# Patient Record
Sex: Female | Born: 1965 | Race: White | Hispanic: No | Smoking: Former smoker
Health system: Southern US, Community
[De-identification: ages and names within clinical notes are randomized; demographics above are authoritative.]

## PROBLEM LIST (undated history)

## (undated) DIAGNOSIS — I10 Essential (primary) hypertension: Secondary | ICD-10-CM

## (undated) DIAGNOSIS — J45909 Unspecified asthma, uncomplicated: Secondary | ICD-10-CM

## (undated) DIAGNOSIS — J302 Other seasonal allergic rhinitis: Secondary | ICD-10-CM

## (undated) DIAGNOSIS — G4730 Sleep apnea, unspecified: Secondary | ICD-10-CM

## (undated) DIAGNOSIS — E669 Obesity, unspecified: Secondary | ICD-10-CM

---

## 2005-03-03 ENCOUNTER — Ambulatory Visit: Payer: Self-pay | Admitting: Obstetrics and Gynecology

## 2009-04-26 ENCOUNTER — Ambulatory Visit: Payer: Self-pay | Admitting: Obstetrics and Gynecology

## 2009-07-18 ENCOUNTER — Ambulatory Visit: Payer: Self-pay | Admitting: Family Medicine

## 2011-08-27 ENCOUNTER — Ambulatory Visit: Payer: Self-pay | Admitting: Family Medicine

## 2012-10-26 ENCOUNTER — Ambulatory Visit: Payer: Self-pay | Admitting: Nurse Practitioner

## 2013-11-21 ENCOUNTER — Ambulatory Visit: Payer: Self-pay | Admitting: Nurse Practitioner

## 2015-04-25 ENCOUNTER — Other Ambulatory Visit: Payer: Self-pay | Admitting: Nurse Practitioner

## 2015-04-25 DIAGNOSIS — Z1231 Encounter for screening mammogram for malignant neoplasm of breast: Secondary | ICD-10-CM

## 2015-05-03 ENCOUNTER — Ambulatory Visit: Payer: Self-pay

## 2015-05-09 ENCOUNTER — Ambulatory Visit
Admission: RE | Admit: 2015-05-09 | Discharge: 2015-05-09 | Disposition: A | Discharge status: Home / Self Care | Payer: BLUE CROSS/BLUE SHIELD | Source: Ambulatory Visit | Attending: Nurse Practitioner | Admitting: Nurse Practitioner

## 2015-05-09 DIAGNOSIS — Z1231 Encounter for screening mammogram for malignant neoplasm of breast: Secondary | ICD-10-CM | POA: Diagnosis not present

## 2015-05-11 ENCOUNTER — Other Ambulatory Visit: Payer: Self-pay | Admitting: Nurse Practitioner

## 2015-05-11 DIAGNOSIS — R928 Other abnormal and inconclusive findings on diagnostic imaging of breast: Secondary | ICD-10-CM

## 2015-05-17 ENCOUNTER — Ambulatory Visit
Admission: RE | Admit: 2015-05-17 | Discharge: 2015-05-17 | Disposition: A | Discharge status: Home / Self Care | Payer: BLUE CROSS/BLUE SHIELD | Source: Ambulatory Visit | Attending: Nurse Practitioner | Admitting: Nurse Practitioner

## 2015-05-17 ENCOUNTER — Ambulatory Visit: Payer: BLUE CROSS/BLUE SHIELD

## 2015-05-17 DIAGNOSIS — R928 Other abnormal and inconclusive findings on diagnostic imaging of breast: Secondary | ICD-10-CM

## 2015-05-17 DIAGNOSIS — R922 Inconclusive mammogram: Secondary | ICD-10-CM | POA: Diagnosis not present

## 2016-09-01 ENCOUNTER — Other Ambulatory Visit: Payer: Self-pay | Admitting: Nurse Practitioner

## 2016-09-01 DIAGNOSIS — Z1231 Encounter for screening mammogram for malignant neoplasm of breast: Secondary | ICD-10-CM

## 2016-09-26 ENCOUNTER — Ambulatory Visit
Admission: RE | Admit: 2016-09-26 | Discharge: 2016-09-26 | Disposition: A | Discharge status: Home / Self Care | Payer: BLUE CROSS/BLUE SHIELD | Source: Ambulatory Visit | Attending: Nurse Practitioner | Admitting: Nurse Practitioner

## 2016-09-26 DIAGNOSIS — Z1231 Encounter for screening mammogram for malignant neoplasm of breast: Secondary | ICD-10-CM

## 2018-05-18 ENCOUNTER — Other Ambulatory Visit: Payer: Self-pay | Admitting: Internal Medicine

## 2018-05-18 DIAGNOSIS — Z1231 Encounter for screening mammogram for malignant neoplasm of breast: Secondary | ICD-10-CM

## 2018-08-26 ENCOUNTER — Encounter (INDEPENDENT_AMBULATORY_CARE_PROVIDER_SITE_OTHER): Payer: Self-pay

## 2018-08-26 ENCOUNTER — Ambulatory Visit
Admission: RE | Admit: 2018-08-26 | Discharge: 2018-08-26 | Disposition: A | Discharge status: Home / Self Care | Payer: BLUE CROSS/BLUE SHIELD | Source: Ambulatory Visit | Attending: Internal Medicine | Admitting: Internal Medicine

## 2018-08-26 DIAGNOSIS — Z1231 Encounter for screening mammogram for malignant neoplasm of breast: Secondary | ICD-10-CM | POA: Diagnosis present

## 2019-08-23 ENCOUNTER — Other Ambulatory Visit: Payer: Self-pay | Admitting: Internal Medicine

## 2019-08-23 DIAGNOSIS — Z1231 Encounter for screening mammogram for malignant neoplasm of breast: Secondary | ICD-10-CM

## 2019-09-01 ENCOUNTER — Other Ambulatory Visit: Payer: Self-pay

## 2019-09-01 ENCOUNTER — Encounter (INDEPENDENT_AMBULATORY_CARE_PROVIDER_SITE_OTHER): Payer: Self-pay

## 2019-09-01 ENCOUNTER — Ambulatory Visit
Admission: RE | Admit: 2019-09-01 | Discharge: 2019-09-01 | Disposition: A | Discharge status: Home / Self Care | Payer: BC Managed Care – PPO | Source: Ambulatory Visit | Attending: Internal Medicine | Admitting: Internal Medicine

## 2019-09-01 DIAGNOSIS — Z1231 Encounter for screening mammogram for malignant neoplasm of breast: Secondary | ICD-10-CM

## 2020-09-05 ENCOUNTER — Other Ambulatory Visit: Payer: Self-pay | Admitting: Internal Medicine

## 2020-09-05 DIAGNOSIS — Z1231 Encounter for screening mammogram for malignant neoplasm of breast: Secondary | ICD-10-CM

## 2020-09-13 ENCOUNTER — Ambulatory Visit
Admission: RE | Admit: 2020-09-13 | Discharge: 2020-09-13 | Disposition: A | Discharge status: Home / Self Care | Payer: BC Managed Care – PPO | Source: Ambulatory Visit | Attending: Internal Medicine | Admitting: Internal Medicine

## 2020-09-13 ENCOUNTER — Other Ambulatory Visit: Payer: Self-pay

## 2020-09-13 DIAGNOSIS — Z1231 Encounter for screening mammogram for malignant neoplasm of breast: Secondary | ICD-10-CM | POA: Diagnosis present

## 2021-11-05 ENCOUNTER — Other Ambulatory Visit: Payer: Self-pay | Admitting: Internal Medicine

## 2021-11-05 DIAGNOSIS — Z1231 Encounter for screening mammogram for malignant neoplasm of breast: Secondary | ICD-10-CM

## 2021-12-30 ENCOUNTER — Other Ambulatory Visit: Payer: Self-pay

## 2021-12-30 ENCOUNTER — Ambulatory Visit
Admission: RE | Admit: 2021-12-30 | Discharge: 2021-12-30 | Disposition: A | Discharge status: Home / Self Care | Payer: BC Managed Care – PPO | Source: Ambulatory Visit | Attending: Internal Medicine | Admitting: Internal Medicine

## 2021-12-30 DIAGNOSIS — Z1231 Encounter for screening mammogram for malignant neoplasm of breast: Secondary | ICD-10-CM | POA: Insufficient documentation

## 2022-04-21 ENCOUNTER — Emergency Department: Payer: 59

## 2022-04-21 ENCOUNTER — Emergency Department
Admission: EM | Admit: 2022-04-21 | Discharge: 2022-04-21 | Disposition: A | Discharge status: Home / Self Care | Payer: 59 | Attending: Student in an Organized Health Care Education/Training Program | Admitting: Student in an Organized Health Care Education/Training Program

## 2022-04-21 ENCOUNTER — Encounter: Payer: Self-pay | Admitting: Emergency Medicine

## 2022-04-21 ENCOUNTER — Other Ambulatory Visit: Payer: Self-pay

## 2022-04-21 DIAGNOSIS — W19XXXA Unspecified fall, initial encounter: Secondary | ICD-10-CM

## 2022-04-21 DIAGNOSIS — W08XXXA Fall from other furniture, initial encounter: Secondary | ICD-10-CM | POA: Diagnosis not present

## 2022-04-21 DIAGNOSIS — M25512 Pain in left shoulder: Secondary | ICD-10-CM | POA: Diagnosis not present

## 2022-04-21 DIAGNOSIS — I10 Essential (primary) hypertension: Secondary | ICD-10-CM | POA: Diagnosis not present

## 2022-04-21 DIAGNOSIS — M545 Low back pain, unspecified: Secondary | ICD-10-CM | POA: Diagnosis present

## 2022-04-21 DIAGNOSIS — Y92009 Unspecified place in unspecified non-institutional (private) residence as the place of occurrence of the external cause: Secondary | ICD-10-CM | POA: Diagnosis not present

## 2022-04-21 MED ORDER — ACETAMINOPHEN 325 MG PO TABS
650.0000 mg | ORAL_TABLET | Freq: Once | ORAL | Status: AC
Start: 2022-04-21 — End: 2022-04-21
  Administered 2022-04-21: 650 mg via ORAL
  Filled 2022-04-21: qty 2

## 2022-04-21 MED ORDER — LIDOCAINE 5 % EX PTCH: 1.0000 | MEDICATED_PATCH | Freq: Two times a day (BID) | CUTANEOUS | 0 refills | Status: AC

## 2022-04-21 MED ORDER — LIDOCAINE 5 % EX PTCH
1.0000 | MEDICATED_PATCH | CUTANEOUS | Status: DC
  Administered 2022-04-21: 1 via TRANSDERMAL
  Filled 2022-04-21: qty 1

## 2022-04-21 MED ORDER — KETOROLAC TROMETHAMINE 15 MG/ML IJ SOLN
15.0000 mg | Freq: Once | INTRAMUSCULAR | Status: AC
  Administered 2022-04-21: 15 mg via INTRAMUSCULAR
  Filled 2022-04-21: qty 1

## 2022-04-21 NOTE — ED Notes (Signed)
Patient transported to X-ray 

## 2022-04-21 NOTE — ED Triage Notes (Signed)
Pt reports was stepping on a step stool, lost her balance and fell. Pt c/o pain to her lower back. Denies loss of bowel or bladder, denies LOC.

## 2022-04-21 NOTE — ED Provider Notes (Signed)
[facility] Provider Note    Event Date/Time   First MD Initiated Contact with Patient 04/21/22 [phone]     (approximate)   History   Fall and Back Pain   HPI  Maria Salas is a 56 y.o. female with a past medical history of hypertension, GERD, depression, obesity, restless leg syndrome who presents today for evaluation after a fall.  Patient reports that she was standing on a small stepstool on the first step to clean the top of the television when she leaned too far and lost her balance.  No preceding symptoms.  She reports that she fell onto her left shoulder and twisted her back in the process.  She reports that she has pain across her low back.  She reports that she does not have any pain if she stays still, however when she twists she reports that the pain radiates across her low back.  She denies any numbness or tingling in her legs.  She denies any urinary or fecal incontinence or retention.  No saddle anesthesia.  She reports that she has been able to ambulate, however has pain with ambulation.  No fevers.  She also reports that she has pain in her left shoulder.  She did not strike her head or lose consciousness.  She does not take anticoagulation.  She denies any abdominal pain or chest pain.  No headache, neck pain, vomiting, vision changes, or difficulty with ambulation.  She has not taken anything for pain today.  There are no problems to display for this patient.         Physical Exam   Triage Vital Signs: ED Triage Vitals  Enc Vitals Group     BP 04/21/22 0822 (!) 150/103     Pulse Rate 04/21/22 0822 (!) 107     Resp 04/21/22 0822 18     Temp 04/21/22 0822 98.3 F (36.8 C)     Temp src --      SpO2 04/21/22 0822 99 %     Weight 04/21/22 0819 275 lb (124.7 kg)     Height 04/21/22 0819 5\' 1"  (1.549 m)     Head Circumference --      Peak Flow --      Pain Score 04/21/22 0819 8     Pain Loc --      Pain Edu? --      Excl. in [place]? --      Most recent vital signs: Vitals:   04/21/22 0822  BP: (!) 150/103  Pulse: (!) 107  Resp: 18  Temp: 98.3 F (36.8 C)  SpO2: 99%    Physical Exam Vitals and nursing note reviewed.  Constitutional:      General: Awake and alert. No acute distress.    Appearance: Normal appearance. She is well-developed and obese.  HENT:     Head: Normocephalic and atraumatic.     Mouth/Throat:     Mouth: Mucous membranes are moist.  Eyes:     General: PERRL. Normal EOMs        Right eye: No discharge.        Left eye: No discharge.     Conjunctiva/sclera: Conjunctivae normal.  Cardiovascular:     Rate and Rhythm: Normal rate and regular rhythm.     Pulses: Normal pulses.     Heart sounds: Normal heart sounds Pulmonary:     Effort: Pulmonary effort is normal. No respiratory distress.     Breath sounds: Normal  breath sounds.  Abdominal:     Abdomen is soft. There is no abdominal tenderness. No rebound or guarding. No distention. Musculoskeletal:        General: No swelling. Normal range of motion.     Cervical back: Normal range of motion and neck supple. No midline cervical spine tenderness.  Full range of motion of neck.  Negative Spurling test.  Negative Lhermitte sign.  Normal strength and sensation in bilateral upper extremities. Normal grip strength bilaterally.  Normal intrinsic muscle function of the hand bilaterally.  Normal radial pulses bilaterally. Back: Diffuse bilateral lumbar paraspinal muscle tenderness without specific midline tenderness. Strength and sensation 5/5 to bilateral lower extremities. Normal great toe extension against resistance. Normal sensation throughout feet. Normal patellar reflexes. Negative SLR and opposite SLR bilaterally.  Left shoulder: No obvious deformity, swelling, ecchymosis, or erythema No clavicular or AC joint tenderness Able to actively and passively forward flex and abduct at shoulder fully, negative drop arm test Negative Obriens, SLAP, empty  can, and lift off tests Normal internal and external rotation against resistance Negative Hawkins and Neers Normal ROM at elbow and wrist Normal resisted pronation and supination 2+ radial pulse Normal grip strength Normal intrinsic hand muscle function Lymphadenopathy:     Cervical: No cervical adenopathy.  Skin:    General: Skin is warm and dry.     Capillary Refill: Capillary refill takes less than 2 seconds.     Findings: No rash.  Neurological:     Mental Status: she is alert.      ED Results / Procedures / Treatments   Labs (all labs ordered are listed, but only abnormal results are displayed) Labs Reviewed - No data to display   EKG     RADIOLOGY I reviewed patient's x-ray independently and agree with the radiologist findings    PROCEDURES:  Critical Care performed:   Procedures   MEDICATIONS ORDERED IN ED: Medications  lidocaine (LIDODERM) 5 % 1 patch (1 patch Transdermal Patch Applied 04/21/22 0921)  ketorolac (TORADOL) 15 MG/ML injection 15 mg (15 mg Intramuscular Given 04/21/22 0926)  acetaminophen (TYLENOL) tablet 650 mg (650 mg Oral Given 04/21/22 [id number])     IMPRESSION / MDM / ASSESSMENT AND PLAN / ED COURSE  I reviewed the triage vital signs and the nursing notes.   Differential diagnosis includes, but is not limited to, contusion, sprain, vertebral fracture, radiculopathy.  Patient is awake and alert, and ambulatory.  She has 5 out of 5 strength with intact sensation to extensor hallucis dorsiflexion and plantarflexion of bilateral lower extremities with normal patellar reflexes bilaterally. Most likely etiology at this point is muscle strain vs herniated disc. No red flags to indicate patient is at risk for more auspicious process that would require urgent/emergent spinal imaging or subspecialty evaluation at this time. No major trauma, no midline tenderness, no history or physical exam findings to suggest cauda equina syndrome or spinal cord  compression. No focal neurological deficits on exam. No constitutional symptoms or history of immunosuppression or IVDA to suggest potential for epidural abscess. Not anticoagulated, no history of bleeding diastasis to suggest risk for epidural hematoma. No chronic steroid use or advanced age or history of malignancy to suggest proclivity towards pathological fracture.  No abdominal pain or flank pain to suggest kidney stone, no history of kidney stone.  No fever or dysuria or CVAT to suggest pyelonephritis .  No chest pain, back pain, shortness of breath, neurological deficits, to suggest vascular catastrophe, and pulses are equal  in all 4 extremities.    No symptoms prior to fall, patient is certain that this was a mechanical fall.  There was no head strike or LOC. X-ray was obtained in triage.  This demonstrated degenerative changes without acute fracture.  Patient was treated symptomatically with improvement of her symptoms.  Patient has full range of motion of her shoulder, declined x-ray of her shoulder.  We discussed return precautions and the importance of close outpatient follow-up. Discussed care instructions and return precautions with patient. Recommended close outpatient follow-up for re-evaluation. Patient agrees with plan of care. Will treat the patient symptomatically as needed for pain control. Will discharge patient to take these medications and return for any worsening or different pain or development of any neurologic symptoms. Educated patient regarding expected time course for back pain to improve and recommended very close outpatient follow-up.  Patient was discharged in stable condition.  She is ambulatory with a steady gait.   Clinical Course as of 04/21/22 0950  Mon Apr 21, 2022  1448 Patient declined shoulder XR [JP]  [phone] Patient reports she feels improved, requesting lidoderm patch prescription [JP]    Clinical Course User Index [JP] Zollie Ellery, Herb Grays, PA-C     FINAL CLINICAL  IMPRESSION(S) / ED DIAGNOSES   Final diagnoses:  Acute bilateral low back pain without sciatica  Fall in home, initial encounter     Rx / DC Orders   ED Discharge Orders          Ordered    lidocaine (LIDODERM) 5 %  Every 12 hours        04/21/22 0940             Note:  This document was prepared using Dragon voice recognition software and may include unintentional dictation errors.   Keturah Shavers 04/21/22 3149    Willy Eddy, MD 04/21/22 [phone]

## 2022-04-21 NOTE — Discharge Instructions (Signed)
You may use the Lidoderm patches in addition to Tylenol/ibuprofen per package instructions to help with your pain.  Please return to the emergency department for any new, worsening, or changing symptoms or other concerns including weakness in your legs, urinary or stool incontinence or retention, numbness or tingling in your extremities/buttocks/groin, fevers, or any other concerns or change in symptoms.  It was a pleasure caring for you today.

## 2022-04-21 NOTE — ED Notes (Signed)
Pt states that she was standing on a step-stool yesterday and fell backwards. Pt reports twisting and landing on her left side. Pt has been having pain in her lower back since falling. Pt reports having tingling in her hands as well and in the tops of her legs if she standing for too long. Pt denies loss of bowel or bladder control.

## 2022-10-15 LAB — COLOGUARD: COLOGUARD: NEGATIVE

## 2022-10-15 LAB — EXTERNAL GENERIC LAB PROCEDURE: COLOGUARD: NEGATIVE

## 2023-01-27 ENCOUNTER — Other Ambulatory Visit: Payer: Self-pay

## 2023-01-27 ENCOUNTER — Ambulatory Visit (INDEPENDENT_AMBULATORY_CARE_PROVIDER_SITE_OTHER): Payer: 59

## 2023-01-27 ENCOUNTER — Ambulatory Visit
Admission: EM | Admit: 2023-01-27 | Discharge: 2023-01-27 | Disposition: A | Discharge status: Home / Self Care | Payer: 59

## 2023-01-27 DIAGNOSIS — M545 Low back pain, unspecified: Secondary | ICD-10-CM

## 2023-01-27 DIAGNOSIS — M25552 Pain in left hip: Secondary | ICD-10-CM

## 2023-01-27 DIAGNOSIS — W19XXXA Unspecified fall, initial encounter: Secondary | ICD-10-CM | POA: Diagnosis not present

## 2023-01-27 HISTORY — DX: Essential (primary) hypertension: I10

## 2023-01-27 HISTORY — DX: Other seasonal allergic rhinitis: J30.2

## 2023-01-27 HISTORY — DX: Obesity, unspecified: E66.9

## 2023-01-27 HISTORY — DX: Sleep apnea, unspecified: G47.30

## 2023-01-27 HISTORY — DX: Unspecified asthma, uncomplicated: J45.909

## 2023-01-27 MED ORDER — NAPROXEN 375 MG PO TABS: 375.0000 mg | ORAL_TABLET | Freq: Two times a day (BID) | ORAL | 0 refills | Status: AC

## 2023-01-27 MED ORDER — LIDOCAINE 5 % EX PTCH: 1.0000 | MEDICATED_PATCH | CUTANEOUS | 0 refills | Status: AC

## 2023-01-27 MED ORDER — METHOCARBAMOL 500 MG PO TABS: 500.0000 mg | ORAL_TABLET | Freq: Two times a day (BID) | ORAL | 0 refills | Status: AC

## 2023-01-27 NOTE — ED Triage Notes (Signed)
Pt states she tripped on rug in home last evening and jarred her left hip. Today she bent over to pick something up and could hardly straighten back up. Pain to left hip, left buttock and left low back.
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
## 2023-01-27 NOTE — Discharge Instructions (Signed)
Your x-rays did not show any active fracture or dislocation.  It did show some degeneration/arthritis.  Start Naprosyn twice daily to help with pain and inflammation.  Do not take NSAIDs with this medication including aspirin, ibuprofen/Advil, naproxen/Aleve.  This includes your prescribed meloxicam/Mobic.  You can use acetaminophen/Tylenol.  Take Robaxin up to twice a day.  This make you sleepy so do not drive or drink alcohol while taking it.  Use lidocaine patches for additional symptom relief.  I recommend you follow-up with sports medicine/orthopedics if your symptoms or not improving quickly.  Please call them to schedule an appointment.  Use heat and gentle rest.  If anything worsens and you have increasing pain, difficulty walking, numbness or tingling in your legs, going to the bathroom yourself without noticing it you need to be seen immediately.

## 2023-02-09 ENCOUNTER — Other Ambulatory Visit: Payer: Self-pay | Admitting: Internal Medicine

## 2023-02-09 DIAGNOSIS — Z1231 Encounter for screening mammogram for malignant neoplasm of breast: Secondary | ICD-10-CM

## 2023-02-10 ENCOUNTER — Ambulatory Visit
Admission: RE | Admit: 2023-02-10 | Discharge: 2023-02-10 | Disposition: A | Discharge status: Home / Self Care | Payer: 59 | Source: Ambulatory Visit | Attending: Internal Medicine | Admitting: Internal Medicine

## 2023-02-10 DIAGNOSIS — Z1231 Encounter for screening mammogram for malignant neoplasm of breast: Secondary | ICD-10-CM | POA: Diagnosis present

## 2023-04-24 ENCOUNTER — Emergency Department
Admission: EM | Admit: 2023-04-24 | Discharge: 2023-04-24 | Disposition: A | Discharge status: Home / Self Care | Payer: 59 | Attending: Emergency Medicine | Admitting: Emergency Medicine

## 2023-04-24 ENCOUNTER — Emergency Department: Payer: 59

## 2023-04-24 ENCOUNTER — Other Ambulatory Visit: Payer: Self-pay

## 2023-04-24 DIAGNOSIS — R0789 Other chest pain: Secondary | ICD-10-CM | POA: Diagnosis present

## 2023-04-24 LAB — BASIC METABOLIC PANEL
Anion gap: 9 (ref 5–15)
BUN: 12 mg/dL (ref 6–20)
CO2: 25 mmol/L (ref 22–32)
Calcium: 8.9 mg/dL (ref 8.9–10.3)
Chloride: 104 mmol/L (ref 98–111)
Creatinine, Ser: 0.64 mg/dL (ref 0.44–1.00)
GFR, Estimated: >60 mL/min (ref >60)
Glucose, Bld: 96 mg/dL (ref 70–99)
Potassium: 3.9 mmol/L (ref 3.5–5.1)
Sodium: 138 mmol/L (ref 135–145)

## 2023-04-24 LAB — CBC
HCT: 49.4 % — ABNORMAL HIGH (ref 36.0–46.0)
Hemoglobin: 15.7 g/dL — ABNORMAL HIGH (ref 12.0–15.0)
MCH: 29.6 pg (ref 26.0–34.0)
MCHC: 31.8 g/dL (ref 30.0–36.0)
MCV: 93 fL (ref 80.0–100.0)
Platelets: 245 10*3/uL (ref 150–400)
RBC: 5.31 MIL/uL — ABNORMAL HIGH (ref 3.87–5.11)
RDW: 12.9 % (ref 11.5–15.5)
WBC: 7.1 10*3/uL (ref 4.0–10.5)
nRBC: 0 % (ref 0.0–0.2)

## 2023-04-24 LAB — TROPONIN I (HIGH SENSITIVITY)
Troponin I (High Sensitivity): 7 ng/L (ref <18)
Troponin I (High Sensitivity): 7 ng/L (ref <18)

## 2023-04-24 MED ORDER — HYDROCODONE-ACETAMINOPHEN 5-325 MG PO TABS
2.0000 | ORAL_TABLET | Freq: Once | ORAL | Status: DC
  Filled 2023-04-24: qty 2

## 2023-04-24 MED ORDER — KETOROLAC TROMETHAMINE 60 MG/2ML IM SOLN
60.0000 mg | Freq: Once | INTRAMUSCULAR | Status: DC
  Filled 2023-04-24: qty 2

## 2023-04-24 NOTE — ED Provider Notes (Signed)
[facility] Provider Note    Event Date/Time   First MD Initiated Contact with Patient 04/24/23 1344     (approximate)   History   Chest Pain   HPI  Maria Salas is a 57 y.o. female  here with chest pain. Pt reports that she was at work today when she developed acute, moderate, aching pain that quickly became severe. She was at rest. It is located mostly in there anterior upper chest and has been fairly constant. It is slightly worse with certain movements and palpation. No alleviating factors. She broke out in a sweat with the onset of sx as well. No fevers. No sputum production.        Physical Exam   Triage Vital Signs: ED Triage Vitals  Enc Vitals Group     BP 04/24/23 1321 (!) 147/88     Pulse Rate 04/24/23 1321 93     Resp 04/24/23 1321 19     Temp 04/24/23 1321 98 F (36.7 C)     Temp Source 04/24/23 1321 Oral     SpO2 04/24/23 1321 96 %     Weight 04/24/23 1410 279 lb 15.8 oz (127 kg)     Height 04/24/23 1410 5' (1.524 m)     Head Circumference --      Peak Flow --      Pain Score 04/24/23 1322 8     Pain Loc --      Pain Edu? --      Excl. in GC? --     Most recent vital signs: Vitals:   04/24/23 1321 04/24/23 1645  BP: (!) 147/88 (!) 142/86  Pulse: 93 90  Resp: 19 18  Temp: 98 F (36.7 C)   SpO2: 96% 96%     General: Awake, no distress.  CV:  Good peripheral perfusion. RRR. Resp:  Normal work of breathing. Lungs clear. Moderate chest wall TTP in parasternal areas lateral to upper sternum. Abd:  No distention. No tenderness. Other:  No le edema.    ED Results / Procedures / Treatments   Labs (all labs ordered are listed, but only abnormal results are displayed) Labs Reviewed  CBC - Abnormal; Notable for the following components:      Result Value   RBC 5.31 (*)    Hemoglobin 15.7 (*)    HCT 49.4 (*)    All other components within normal limits  BASIC METABOLIC PANEL  TROPONIN I (HIGH SENSITIVITY)   TROPONIN I (HIGH SENSITIVITY)     EKG Normal sinus rhythm, VR 86. PR 176, QRS 134, QTc 459. No acute St elevations or depressions. TWI in inferior and lateral precordial. No St elevations.   RADIOLOGY CXR: Clear, no acute disease   I also independently reviewed and agree with radiologist interpretations.   PROCEDURES:  Critical Care performed: No  Procedures    MEDICATIONS ORDERED IN ED: Medications  HYDROcodone-acetaminophen (NORCO/VICODIN) 5-325 MG per tablet 2 tablet (2 tablets Oral Patient Refused/Not Given 04/24/23 1518)  ketorolac (TORADOL) injection 60 mg (60 mg Intramuscular Patient Refused/Not Given 04/24/23 1518)     IMPRESSION / MDM / ASSESSMENT AND PLAN / ED COURSE  I reviewed the triage vital signs and the nursing notes.                              Differential diagnosis includes, but is not limited to, chest wall pain, costochondritis, ACS, PNA,  PTX, atelectasis, GERD, gastritis  Patient's presentation is most consistent with acute presentation with potential threat to life or bodily function.  The patient is on the cardiac monitor to evaluate for evidence of arrhythmia and/or significant heart rate changes  57 yo F here with atypical chest pain. No h/o ACS. Pt is well appearing here. EKG nonischemic and trop neg x 2 with constant sx - do not suspect ACS. No signs of PTX, PNA on CXR. CBC without leukocytosis or anemia. BMP normal.   Repeat troponin negative. Suspect MSK chest pain. No signs of PE, dissection. Abdomen is soft and nontender. Will tx supportively and d/c with outpt follow-up.  Given the diaphoresis and baseline TWA on EKG, however, I do think it's reasonable to f/u with Cardiology. Instructed her to call Cards for close f/u.   FINAL CLINICAL IMPRESSION(S) / ED DIAGNOSES   Final diagnoses:  Atypical chest pain     Rx / DC Orders   ED Discharge Orders     None        Note:  This document was prepared using Dragon voice  recognition software and may include unintentional dictation errors.   Shaune Pollack, MD 04/24/23 2030

## 2023-04-24 NOTE — ED Triage Notes (Signed)
Pt presents to ED with c/o of CP while sitting down at work. NAD noted. Pt denies SOB, N/V/D.

## 2023-05-29 IMAGING — MG MM DIGITAL SCREENING BILAT W/ TOMO AND CAD
8 of 15 series · 8 of 40 positions shown · non-contrast
Comparison: Previous exam(s).

CLINICAL DATA: Screening.

EXAM:
DIGITAL SCREENING BILATERAL MAMMOGRAM WITH TOMOSYNTHESIS AND CAD
TECHNIQUE: Bilateral screening digital craniocaudal and mediolateral oblique
mammograms were obtained. Bilateral screening digital breast
tomosynthesis was performed. The images were evaluated with
computer-aided detection.

[L MLO synth-2D (1 of 3)]
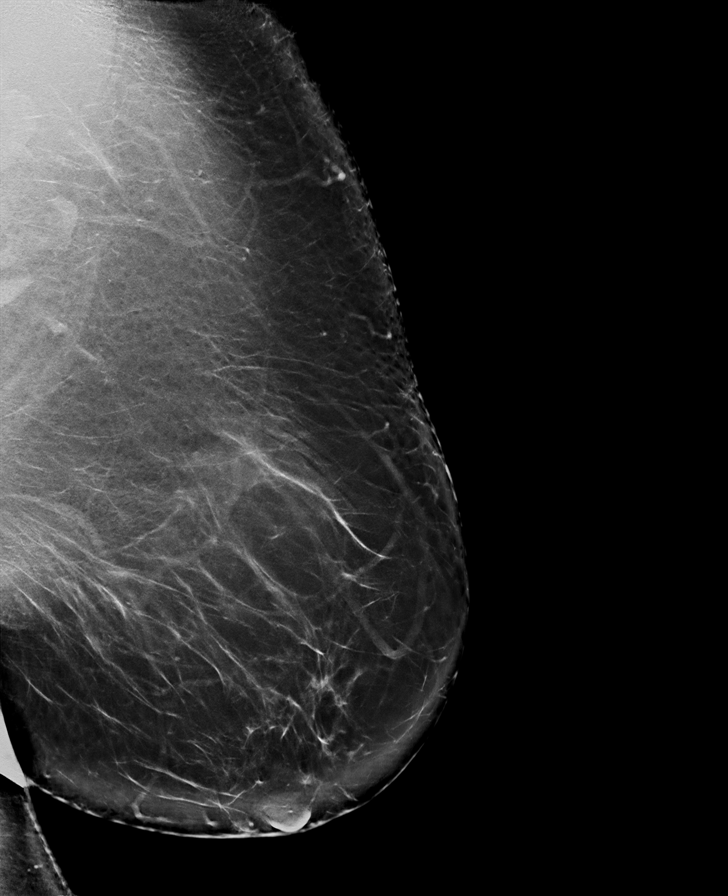

[R MLO synth-2D (1 of 2)]
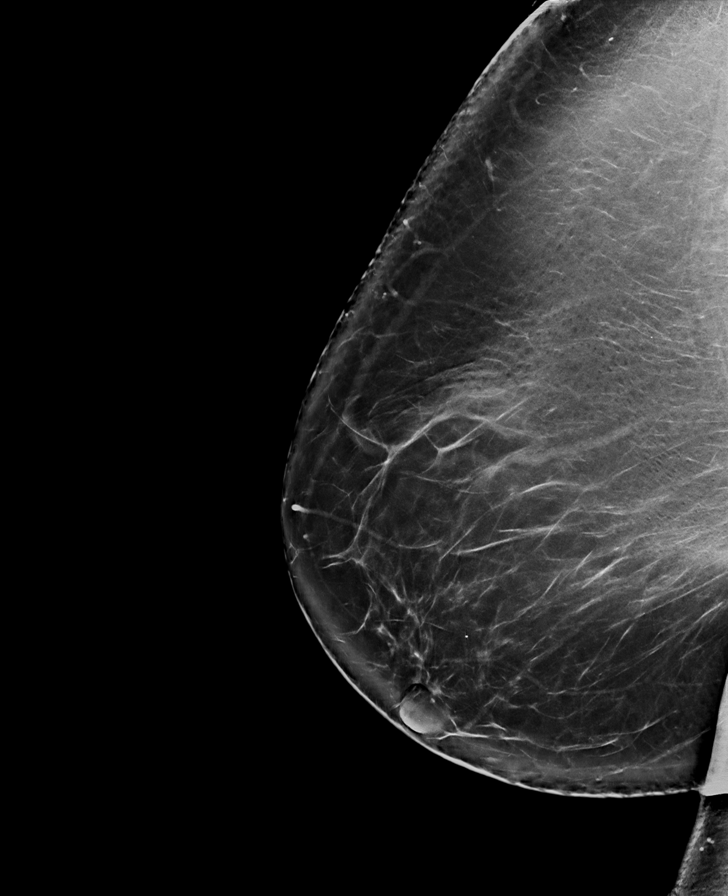

[L CC synth-2D]
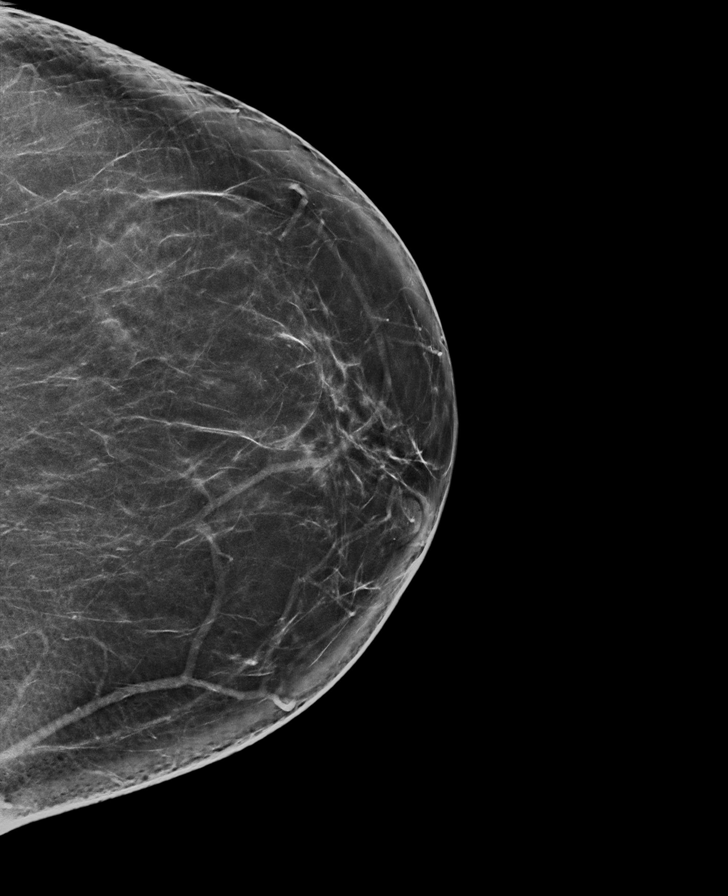

[R CC synth-2D]
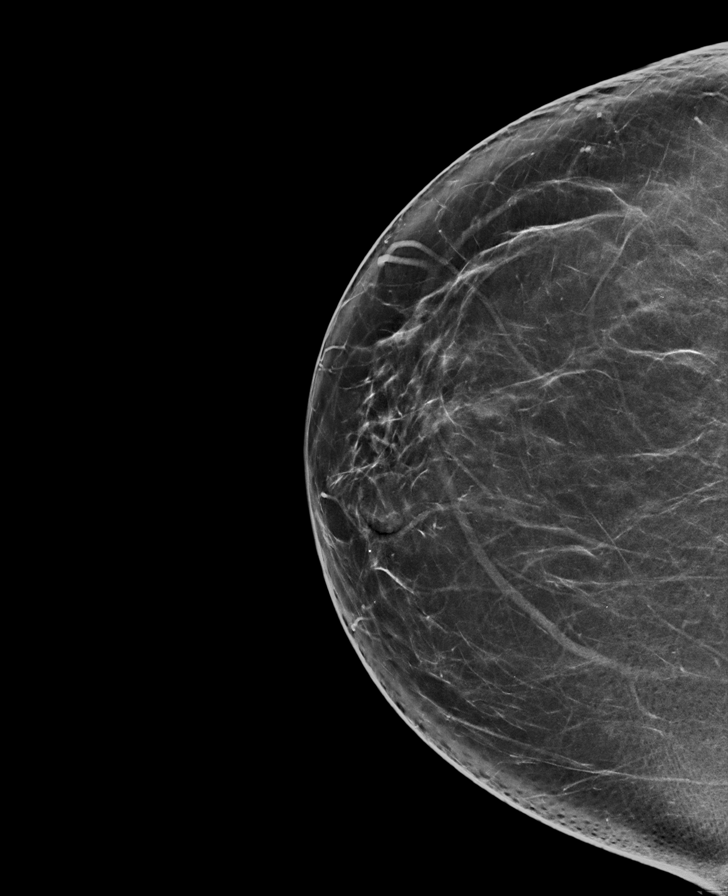

[R MLO synth-2D (2 of 2)]
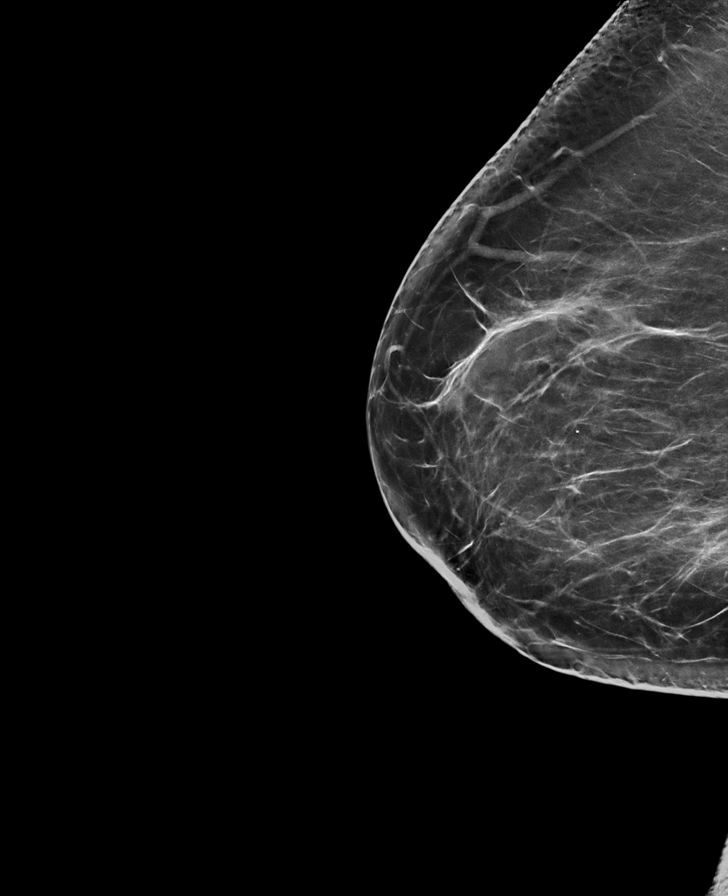

[L MLO synth-2D (2 of 3)]
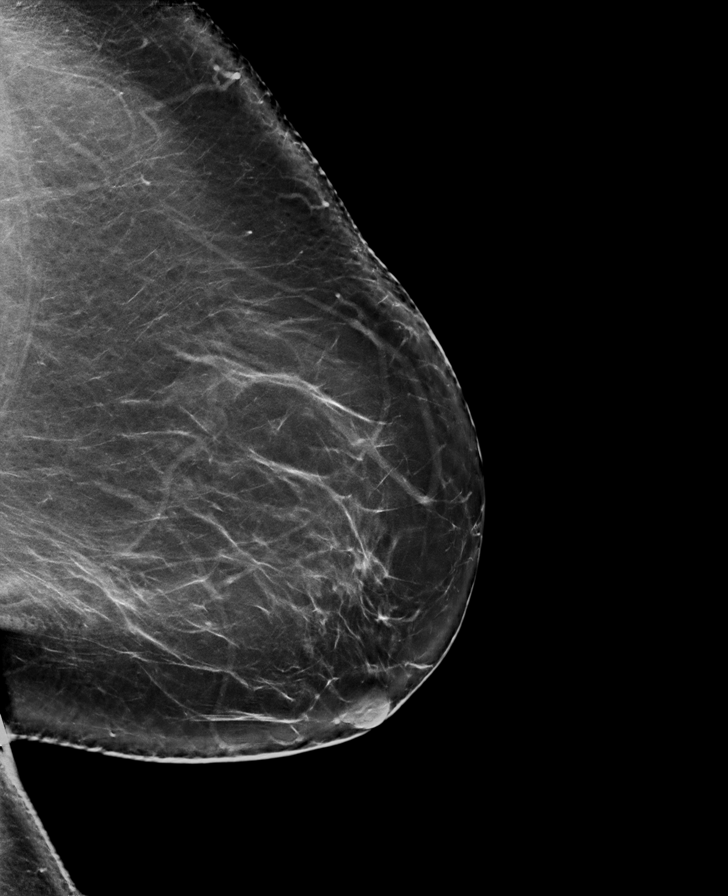

[L MLO synth-2D (3 of 3)]
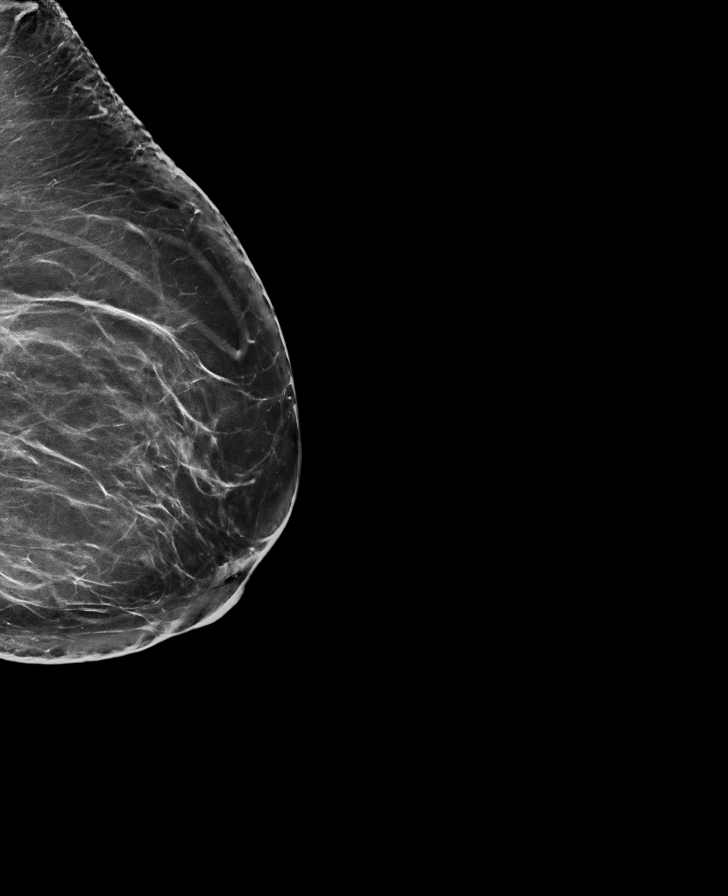

[L MLO tomo · tomo slice 68/99.0]
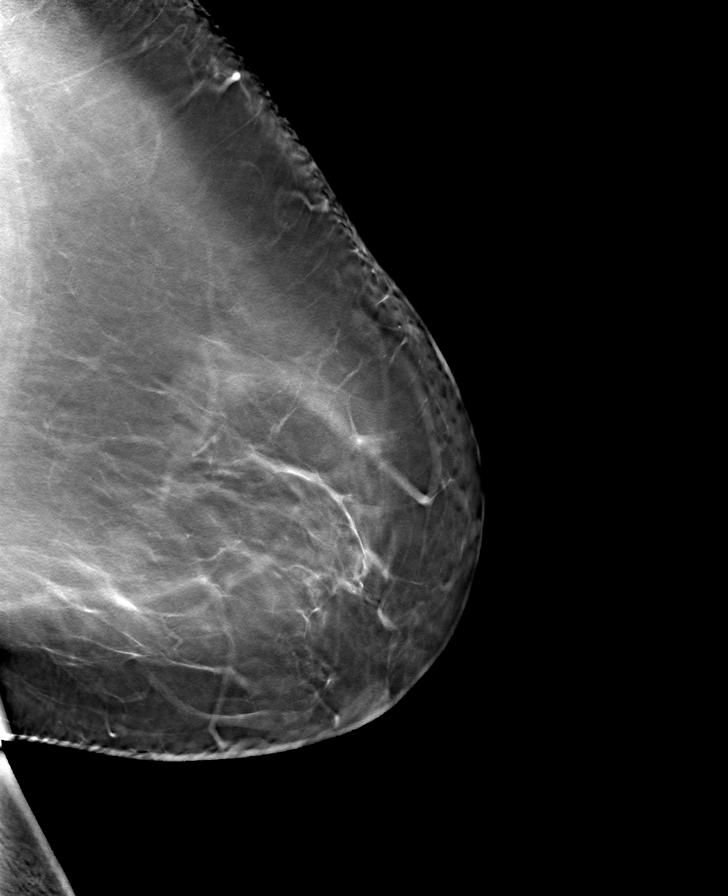

[8 of 40 positions shown; findings below may reference images not displayed]

ACR Breast Density Category b: There are scattered areas of
fibroglandular density.
FINDINGS: There are no findings suspicious for malignancy.
IMPRESSION: No mammographic evidence of malignancy. A result letter of this
screening mammogram will be mailed directly to the patient.

RECOMMENDATION:
Screening mammogram in one year. (Code:51-O-LD2)

BI-RADS CATEGORY  1: Negative.

## 2024-03-22 ENCOUNTER — Other Ambulatory Visit: Payer: Self-pay | Admitting: Internal Medicine

## 2024-03-22 DIAGNOSIS — Z1231 Encounter for screening mammogram for malignant neoplasm of breast: Secondary | ICD-10-CM

## 2024-04-19 ENCOUNTER — Ambulatory Visit
Admission: RE | Admit: 2024-04-19 | Discharge: 2024-04-19 | Disposition: A | Discharge status: Home / Self Care | Source: Ambulatory Visit | Attending: Internal Medicine | Admitting: Internal Medicine

## 2024-04-19 DIAGNOSIS — Z1231 Encounter for screening mammogram for malignant neoplasm of breast: Secondary | ICD-10-CM | POA: Insufficient documentation
